# Patient Record
Sex: Female | Born: 1965 | Race: White | Hispanic: No | Marital: Married | Smoking: Never smoker
Health system: Southern US, Community
[De-identification: ages and names within clinical notes are randomized; demographics above are authoritative.]

## PROBLEM LIST (undated history)

## (undated) DIAGNOSIS — E7800 Pure hypercholesterolemia, unspecified: Secondary | ICD-10-CM

## (undated) DIAGNOSIS — E119 Type 2 diabetes mellitus without complications: Secondary | ICD-10-CM

## (undated) DIAGNOSIS — I10 Essential (primary) hypertension: Secondary | ICD-10-CM

## (undated) HISTORY — PX: ENDOMETRIAL ABLATION: SHX621

---

## 1999-08-05 ENCOUNTER — Other Ambulatory Visit (HOSPITAL_COMMUNITY): Admission: RE | Admit: 1999-08-05 | Discharge: 1999-08-27 | Payer: Self-pay | Admitting: Psychiatry

## 2006-06-06 ENCOUNTER — Encounter: Admission: RE | Admit: 2006-06-06 | Discharge: 2006-06-06 | Payer: Self-pay | Admitting: Specialist

## 2006-10-26 ENCOUNTER — Ambulatory Visit: Payer: Self-pay | Admitting: Orthopedic Surgery

## 2006-11-20 ENCOUNTER — Ambulatory Visit: Payer: Self-pay | Admitting: Orthopedic Surgery

## 2007-06-11 ENCOUNTER — Encounter: Admission: RE | Admit: 2007-06-11 | Discharge: 2007-06-11 | Payer: Self-pay | Admitting: Obstetrics and Gynecology

## 2008-06-23 ENCOUNTER — Encounter: Admission: RE | Admit: 2008-06-23 | Discharge: 2008-06-23 | Payer: Self-pay | Admitting: Obstetrics and Gynecology

## 2009-06-26 ENCOUNTER — Encounter: Admission: RE | Admit: 2009-06-26 | Discharge: 2009-06-26 | Payer: Self-pay | Admitting: Obstetrics and Gynecology

## 2010-06-28 ENCOUNTER — Encounter: Admission: RE | Admit: 2010-06-28 | Discharge: 2010-06-28 | Payer: Self-pay | Admitting: Obstetrics and Gynecology

## 2011-05-23 ENCOUNTER — Other Ambulatory Visit: Payer: Self-pay | Admitting: Family Medicine

## 2011-05-23 DIAGNOSIS — Z1231 Encounter for screening mammogram for malignant neoplasm of breast: Secondary | ICD-10-CM

## 2011-07-06 ENCOUNTER — Ambulatory Visit
Admission: RE | Admit: 2011-07-06 | Discharge: 2011-07-06 | Disposition: A | Discharge status: Home / Self Care | Payer: BC Managed Care – PPO | Source: Ambulatory Visit | Attending: Family Medicine | Admitting: Family Medicine

## 2011-07-06 DIAGNOSIS — Z1231 Encounter for screening mammogram for malignant neoplasm of breast: Secondary | ICD-10-CM

## 2011-07-11 ENCOUNTER — Telehealth (HOSPITAL_COMMUNITY): Payer: Self-pay | Admitting: Dietician

## 2011-07-18 ENCOUNTER — Ambulatory Visit: Payer: Self-pay

## 2011-08-10 NOTE — Telephone Encounter (Signed)
Sent letters to pt home via [place] [employer] on 07/13/11, 07/20/11, 07/27/11, and 08/04/11 in attempt to contact pt. Also provided information about diabetes class at [facility] with letter.  Attempts to contact pt have been unsuccessful thus far. Referral filed.

## 2011-10-04 ENCOUNTER — Telehealth (HOSPITAL_COMMUNITY): Payer: Self-pay | Admitting: Dietician

## 2011-10-04 NOTE — Telephone Encounter (Signed)
Appointment scheduled for 10/14/11 at 2:00 PM.

## 2011-10-10 ENCOUNTER — Telehealth (HOSPITAL_COMMUNITY): Payer: Self-pay | Admitting: Dietician

## 2011-10-10 NOTE — Telephone Encounter (Signed)
Received voicemail from pt at 11:58 AM. Pt requesting information on where appointment is located. Sent letter to pt home with appointment confirmation and instructions on how to get to [facility].

## 2011-10-14 ENCOUNTER — Encounter (HOSPITAL_COMMUNITY): Payer: Self-pay | Admitting: Dietician

## 2011-10-17 ENCOUNTER — Encounter (HOSPITAL_COMMUNITY): Payer: Self-pay | Admitting: Dietician

## 2011-10-17 NOTE — Progress Notes (Signed)
Outpatient Initial Nutrition Assessment  Date:10/14/2011   Time: 2:00 PM  Referring Physician: [facility] at [facility] Reason for Visit: pre-diabetes  Nutrition Assessment:  Ht: Height: 5\' 5"  (165.1 cm)   Wt:Weight: 192 lb (87.091 kg)   IBW: 125# %IBW: 154% UBW: 190# %UBW: 101% BMI: Body mass index is 31.95 kg/(m^2).  Goal Weight: 173# (10% weight loss) Weight hx: Pt reports that she is at her highest weight. Her lowest weight was 165-170# in her 20's.   Estimated nutritional needs: 1727-1884 kcals daily, 70-87 grams protein daily, 1.7-1.9 L fluid daily  PMH: History reviewed. No pertinent past medical history.  Medications:  Current Outpatient Rx  Name Route Sig Dispense Refill  . METFORMIN HCL 500 MG PO TABS Oral Take 500 mg by mouth daily.      Labs: CMP  No results found for this basename: na, k, cl, co2, glucose, bun, creatinine, calcium, prot, albumin, ast, alt, alkphos, bilitot, gfrnonaa, gfraa    Lipid Panel  No results found for this basename: chol, trig, hdl, cholhdl, vldl, ldlcalc     No results found for this basename: HGBA1C   No results found for this basename: GLUF, MICROALBUR, LDLCALC, CREATININE    Per records for [facility], Hgb A1c: 6.3.  Lifestyle/ social habits: Ms. Mohiuddin lives in [place], [place] with her husband. She has two grown sons, age 78 (who lives in [place]) and age 27 (who lives in [place]). She has 4 grandchildren. 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 She quit smoking in September 2012. She reports her stress level at a 9, citing work Counselling psychologist, Training and development officer at work, and caring for her father-in-law at a nursing home in [place] as her major sources of stress. She walks her dog 1-2 miles daily, but does not participate in any structured exercise outside of her job and home responsibilities. She reports frustration that her health has been declining since quitting  smoking. She does not like to take medications and would like to control her health through diet and exercise.   Nutrition hx/habits: Ms. Narvaez describes her eating habits as "sporadic". She eats "on the run" and often eats lunch at her desk, due to the demands of her job. She likes to eat a lot of fresh vegetables. She reports that "nothing tastes right expect eggs and fresh fruit" now that she has quit smoking. Most of her meals are prepared at home and her husband does most of the cooking. She has cut back on bread. She eats out 1-3 times per week, frequenting McDonald's, pizza places, or a take-out salad. She keeps applesauce in her drawer at work. She reports that she sometimes does not eat lunch and consequently feels her blood sugar drop. She drinks mostly unsweetened tea, water, and diet soda. She is looking forward to the summer so she can be more active and prepare most of her meals on the grill.   Diet recall: Breakfast (6:45 AM): oatmeal OR cheerios with 2% milk OR omelet with cheese, tomato, spinach, [place] bacon; Lunch (12:00): salad OR green beans, salad, and grilled chicken OR slice of pizza OR [place] sandwich; Dinner: chicken/fish.pork chop, steamed veggies   Nutrition Diagnosis: Inconsistent carbohydrate intake r/t disordered eating pattern AEB Hgb A1c: 6.3.  Nutrition Intervention: Nutrition rx:1500 kcal diabetic, NAS diet; 3 meals per day, 4-5 hours apart; limit 1 starch per meal; low calorie beverages only; 30 minutes physical activity daily  Education/Counseling Provided: Educated pt on diabetic diet principles.  Discussed sources of carbohydrate, portion sizes, and plate method. Discussed importance of eating 3 meals per day. Discussed importance of regular physical activity to assist with weight loss and optimizing glycemic control. Discussed small, moderate weight loss of 1-2# per week and goal of 10% weight loss. Provided plate method handout.    Understanding, Motivation,  Ability to Follow Recommendations: Expect fair to good compliance.   Monitoring and Evaluation: Goals: 1) 1-2# weight loss per week; 2) 30 minutes physical activity daily; 3) Hgb A1c < 6.3  Recommendations: 1) For weight loss: 4540-9811 kcals daily; 2) Make lunch night before to bring to work; 3) Prepare extra portions of food and freeze remainder of meal to use for later; 4) Walk during lunch break  F/U: PRN. Provided RD contact information.  Orlene Plum, RD  10/14/2011  Time: 2:00 PM

## 2015-01-15 ENCOUNTER — Emergency Department (HOSPITAL_COMMUNITY)
Admission: EM | Admit: 2015-01-15 | Discharge: 2015-01-15 | Disposition: A | Discharge status: Home / Self Care | Payer: BLUE CROSS/BLUE SHIELD | Source: Home / Self Care | Attending: Family Medicine | Admitting: Family Medicine

## 2015-01-15 ENCOUNTER — Encounter (HOSPITAL_COMMUNITY): Payer: Self-pay | Admitting: *Deleted

## 2015-01-15 DIAGNOSIS — R05 Cough: Secondary | ICD-10-CM

## 2015-01-15 DIAGNOSIS — R0982 Postnasal drip: Secondary | ICD-10-CM

## 2015-01-15 DIAGNOSIS — R059 Cough, unspecified: Secondary | ICD-10-CM

## 2015-01-15 MED ORDER — GUAIFENESIN-CODEINE 100-10 MG/5ML PO SOLN: 5.0000 mL | Freq: Four times a day (QID) | ORAL | Status: AC | PRN

## 2015-01-15 MED ORDER — IPRATROPIUM BROMIDE 0.06 % NA SOLN: 2.0000 | Freq: Four times a day (QID) | NASAL | Status: AC

## 2015-01-15 MED ORDER — BENZONATATE 100 MG PO CAPS: 100.0000 mg | ORAL_CAPSULE | Freq: Three times a day (TID) | ORAL | Status: AC | PRN

## 2015-01-15 MED ORDER — FLUTICASONE PROPIONATE 50 MCG/ACT NA SUSP: 2.0000 | Freq: Every day | NASAL | Status: AC

## 2015-01-15 NOTE — Discharge Instructions (Signed)
Your symptoms are likely due to nasal sinus irritation causing postnasal drip and lung irritation. Has no signs of pneumonia or other serious infection at this point time. Please send a nasal Atrovent to help dry. Nasal secretions, Flonase at night to decrease the inflammation. Please also consider using a daily allergy medicine such as Zyrtec or Allegra. Please use the Tessalon Perles during the daytime for cough relief and the Robitussin-AC at night for sleep.

## 2015-01-15 NOTE — ED Notes (Signed)
Pt is here with complaints of persistent cough. Pt reports congestion, HA, sore throat, and chills that have resolved. However dry cough has continued since 01/07/15.

## 2015-01-15 NOTE — ED Provider Notes (Signed)
CSN: [id number]     Arrival date & time 01/15/15  1327 History   First MD Initiated Contact with Patient 01/15/15 1425     Chief Complaint  Patient presents with  . Cough   (Consider location/radiation/quality/duration/timing/severity/associated sxs/prior Treatment) HPI   Cough: dry. Ongoing for 1 week. Tussin DM w/ some improvement. Worse at night. Occasional w/ minimal phlegm production. Started out w/ rinorrhea, and upper respiratory congestion. Denies fevers, SOB, CP, nasuea, vomiting, HA.   History reviewed. No pertinent past medical history. History reviewed. No pertinent past surgical history. Family History  Problem Relation Age of Onset  . Diabetes type II Mother   . Hypertension Mother   . Diabetes type II Other   . Diabetes type II Other    History  Substance Use Topics  . Smoking status: Never Smoker   . Smokeless tobacco: Not on file  . Alcohol Use: No   OB History    No data available     Review of Systems Per HPI with all other pertinent systems negative.   Allergies  Review of patient's allergies indicates no known allergies.  Home Medications   Prior to Admission medications   Medication Sig Start Date End Date Taking? Authorizing Provider  benzonatate (TESSALON PERLES) 100 MG capsule Take 1-2 capsules (100-200 mg total) by mouth 3 (three) times daily as needed for cough. 01/15/15   Ozella Rocks, MD  fluticasone (FLONASE) 50 MCG/ACT nasal spray Place 2 sprays into both nostrils at bedtime. 01/15/15   Ozella Rocks, MD  guaiFENesin-codeine 100-10 MG/5ML syrup Take 5-10 mLs by mouth every 6 (six) hours as needed for cough. 01/15/15   Ozella Rocks, MD  ipratropium (ATROVENT) 0.06 % nasal spray Place 2 sprays into both nostrils 4 (four) times daily. 01/15/15   Ozella Rocks, MD  metFORMIN (GLUCOPHAGE) 500 MG tablet Take 500 mg by mouth daily.    Historical Provider, MD   BP 167/81 mmHg  Pulse 95  Temp(Src) 98.8 F (37.1 C) (Oral)  Resp 16  SpO2  98% Physical Exam Physical Exam  Constitutional: oriented to person, place, and time. appears well-developed and well-nourished. No distress.  HENT:  Head: Normocephalic and atraumatic.  Boggy nasal turbinates.  Minimal pharyngeal cobblestoning Eyes: EOMI. PERRL.  Neck: Normal range of motion.  Cardiovascular: RRR, no m/r/g, 2+ distal pulses,  Pulmonary/Chest: Effort normal and breath sounds normal. No respiratory distress.  Abdominal: Soft. Bowel sounds are normal. NonTTP, no distension.  Musculoskeletal: Normal range of motion. Non ttp, no effusion.  Neurological: alert and oriented to person, place, and time.  Skin: Skin is warm. No rash noted. non diaphoretic.  Psychiatric: normal mood and affect. behavior is normal. Judgment and thought content normal.   ED Course  Procedures (including critical care time) Labs Review Labs Reviewed - No data to display  Imaging Review No results found.   MDM   1. Post-nasal drip   2. Cough    Nasal Atrovent, Flonase, Zyrtec, Tessalon Perles, Robitussin-AC, Discussed likely benign nature of symptoms.    Ozella Rocks, MD 01/15/15 [id number]

## 2015-07-06 ENCOUNTER — Other Ambulatory Visit: Payer: Self-pay

## 2015-07-06 DIAGNOSIS — Z1231 Encounter for screening mammogram for malignant neoplasm of breast: Secondary | ICD-10-CM

## 2015-08-05 ENCOUNTER — Ambulatory Visit: Payer: Self-pay

## 2015-08-11 ENCOUNTER — Ambulatory Visit
Admission: RE | Admit: 2015-08-11 | Discharge: 2015-08-11 | Disposition: A | Discharge status: Home / Self Care | Payer: BLUE CROSS/BLUE SHIELD | Source: Ambulatory Visit

## 2015-08-11 DIAGNOSIS — Z1231 Encounter for screening mammogram for malignant neoplasm of breast: Secondary | ICD-10-CM

## 2015-08-12 ENCOUNTER — Other Ambulatory Visit: Payer: Self-pay | Admitting: Family Medicine

## 2015-08-12 DIAGNOSIS — R928 Other abnormal and inconclusive findings on diagnostic imaging of breast: Secondary | ICD-10-CM

## 2015-08-18 ENCOUNTER — Other Ambulatory Visit: Payer: Self-pay

## 2015-08-21 ENCOUNTER — Ambulatory Visit
Admission: RE | Admit: 2015-08-21 | Discharge: 2015-08-21 | Disposition: A | Discharge status: Home / Self Care | Payer: BLUE CROSS/BLUE SHIELD | Source: Ambulatory Visit | Attending: Family Medicine | Admitting: Family Medicine

## 2015-08-21 DIAGNOSIS — R928 Other abnormal and inconclusive findings on diagnostic imaging of breast: Secondary | ICD-10-CM

## 2016-11-16 ENCOUNTER — Other Ambulatory Visit: Payer: Self-pay | Admitting: Family Medicine

## 2016-11-16 DIAGNOSIS — Z1231 Encounter for screening mammogram for malignant neoplasm of breast: Secondary | ICD-10-CM

## 2016-12-12 ENCOUNTER — Ambulatory Visit
Admission: RE | Admit: 2016-12-12 | Discharge: 2016-12-12 | Disposition: A | Discharge status: Home / Self Care | Payer: BLUE CROSS/BLUE SHIELD | Source: Ambulatory Visit | Attending: Family Medicine | Admitting: Family Medicine

## 2016-12-12 DIAGNOSIS — Z1231 Encounter for screening mammogram for malignant neoplasm of breast: Secondary | ICD-10-CM

## 2019-04-17 ENCOUNTER — Other Ambulatory Visit: Payer: Self-pay | Admitting: Family Medicine

## 2019-04-17 DIAGNOSIS — Z1231 Encounter for screening mammogram for malignant neoplasm of breast: Secondary | ICD-10-CM

## 2019-05-31 ENCOUNTER — Other Ambulatory Visit: Payer: Self-pay

## 2019-05-31 ENCOUNTER — Ambulatory Visit
Admission: RE | Admit: 2019-05-31 | Discharge: 2019-05-31 | Disposition: A | Discharge status: Home / Self Care | Payer: BC Managed Care – PPO | Source: Ambulatory Visit | Attending: Family Medicine | Admitting: Family Medicine

## 2019-05-31 DIAGNOSIS — Z1231 Encounter for screening mammogram for malignant neoplasm of breast: Secondary | ICD-10-CM

## 2020-05-26 ENCOUNTER — Other Ambulatory Visit: Payer: Self-pay | Admitting: Family Medicine

## 2020-05-26 DIAGNOSIS — Z1231 Encounter for screening mammogram for malignant neoplasm of breast: Secondary | ICD-10-CM

## 2020-08-14 ENCOUNTER — Ambulatory Visit
Admission: RE | Admit: 2020-08-14 | Discharge: 2020-08-14 | Disposition: A | Discharge status: Home / Self Care | Payer: 59 | Source: Ambulatory Visit | Attending: Family Medicine | Admitting: Family Medicine

## 2020-08-14 ENCOUNTER — Other Ambulatory Visit: Payer: Self-pay

## 2020-08-14 DIAGNOSIS — Z1231 Encounter for screening mammogram for malignant neoplasm of breast: Secondary | ICD-10-CM

## 2021-07-15 ENCOUNTER — Emergency Department (HOSPITAL_COMMUNITY): Payer: 59

## 2021-07-15 ENCOUNTER — Emergency Department (HOSPITAL_COMMUNITY)
Admission: EM | Admit: 2021-07-15 | Discharge: 2021-07-15 | Disposition: A | Discharge status: Home / Self Care | Payer: 59 | Attending: Emergency Medicine | Admitting: Emergency Medicine

## 2021-07-15 ENCOUNTER — Encounter (HOSPITAL_COMMUNITY): Payer: Self-pay | Admitting: *Deleted

## 2021-07-15 ENCOUNTER — Other Ambulatory Visit: Payer: Self-pay

## 2021-07-15 DIAGNOSIS — M25561 Pain in right knee: Secondary | ICD-10-CM

## 2021-07-15 DIAGNOSIS — Z7984 Long term (current) use of oral hypoglycemic drugs: Secondary | ICD-10-CM | POA: Diagnosis not present

## 2021-07-15 DIAGNOSIS — I10 Essential (primary) hypertension: Secondary | ICD-10-CM | POA: Diagnosis not present

## 2021-07-15 DIAGNOSIS — M5431 Sciatica, right side: Secondary | ICD-10-CM

## 2021-07-15 DIAGNOSIS — Z7951 Long term (current) use of inhaled steroids: Secondary | ICD-10-CM | POA: Diagnosis not present

## 2021-07-15 DIAGNOSIS — M79604 Pain in right leg: Secondary | ICD-10-CM | POA: Insufficient documentation

## 2021-07-15 DIAGNOSIS — E119 Type 2 diabetes mellitus without complications: Secondary | ICD-10-CM | POA: Insufficient documentation

## 2021-07-15 DIAGNOSIS — M5441 Lumbago with sciatica, right side: Secondary | ICD-10-CM | POA: Insufficient documentation

## 2021-07-15 DIAGNOSIS — M25551 Pain in right hip: Secondary | ICD-10-CM | POA: Diagnosis present

## 2021-07-15 DIAGNOSIS — M545 Low back pain, unspecified: Secondary | ICD-10-CM

## 2021-07-15 HISTORY — DX: Pure hypercholesterolemia, unspecified: E78.00

## 2021-07-15 HISTORY — DX: Type 2 diabetes mellitus without complications: E11.9

## 2021-07-15 HISTORY — DX: Essential (primary) hypertension: I10

## 2021-07-15 LAB — CBG MONITORING, ED: Glucose-Capillary: 111 mg/dL — ABNORMAL HIGH (ref 70–99)

## 2021-07-15 MED ORDER — HYDROCODONE-ACETAMINOPHEN 5-325 MG PO TABS: 1.0000 | ORAL_TABLET | ORAL | 0 refills | Status: AC | PRN

## 2021-07-15 MED ORDER — IBUPROFEN 600 MG PO TABS: 600.0000 mg | ORAL_TABLET | Freq: Four times a day (QID) | ORAL | 0 refills | Status: AC | PRN

## 2021-07-15 MED ORDER — HYDROCODONE-ACETAMINOPHEN 5-325 MG PO TABS
1.0000 | ORAL_TABLET | Freq: Once | ORAL | Status: AC
  Administered 2021-07-15: 1 via ORAL
  Filled 2021-07-15: qty 1

## 2021-07-15 MED ORDER — DIAZEPAM 5 MG PO TABS
5.0000 mg | ORAL_TABLET | Freq: Once | ORAL | Status: AC
  Administered 2021-07-15: 5 mg via ORAL
  Filled 2021-07-15: qty 1

## 2021-07-15 MED ORDER — LIDOCAINE 5 % EX PTCH: 1.0000 | MEDICATED_PATCH | Freq: Every day | CUTANEOUS | 0 refills | Status: AC | PRN

## 2021-07-15 MED ORDER — LIDOCAINE 5 % EX PTCH
1.0000 | MEDICATED_PATCH | CUTANEOUS | Status: DC
  Administered 2021-07-15: 1 via TRANSDERMAL
  Filled 2021-07-15 (×2): qty 1

## 2021-07-15 MED ORDER — METHOCARBAMOL 500 MG PO TABS: 1000.0000 mg | ORAL_TABLET | Freq: Two times a day (BID) | ORAL | 0 refills | Status: AC

## 2021-07-15 MED ORDER — KETOROLAC TROMETHAMINE 60 MG/2ML IM SOLN
60.0000 mg | Freq: Once | INTRAMUSCULAR | Status: AC
  Administered 2021-07-15: 60 mg via INTRAMUSCULAR
  Filled 2021-07-15: qty 2

## 2021-07-15 MED ORDER — LIDOCAINE 5 % EX PTCH
1.0000 | MEDICATED_PATCH | CUTANEOUS | Status: DC
  Administered 2021-07-15: 1 via TRANSDERMAL

## 2021-07-15 MED ORDER — HYDROMORPHONE HCL 1 MG/ML IJ SOLN
1.0000 mg | Freq: Once | INTRAMUSCULAR | Status: AC
  Administered 2021-07-15: 1 mg via SUBCUTANEOUS
  Filled 2021-07-15: qty 1

## 2021-07-15 MED ORDER — IBUPROFEN 800 MG PO TABS
800.0000 mg | ORAL_TABLET | Freq: Once | ORAL | Status: AC
  Administered 2021-07-15: 800 mg via ORAL
  Filled 2021-07-15: qty 1

## 2021-07-15 NOTE — ED Triage Notes (Signed)
Pt c/o right hip pain worsening over the last 4 days. Pt has seen a chiropractor and was then told to come to the ED for further evaluation. Pt fell down a hill several weeks ago and had some trouble with it then, but not nearly as bad as now.

## 2021-07-15 NOTE — ED Notes (Signed)
Assisted pt with ambulation to restroom.

## 2021-07-15 NOTE — ED Provider Notes (Signed)
[facility] EMERGENCY DEPARTMENT Provider Note   CSN: [id number] Arrival date & time: 07/15/21  3825     History Chief Complaint  Patient presents with   Hip Pain    Joy King is a 55 y.o. female.  This is a 55 y.o. female with significant medical history as below, including DM, HLD, HTN who presents to the ED with complaint of hip / leg pain to the right.  Pain has been ongoing for approximately 3 to 4 weeks, worse in the past week or so patient with a fall 1.5 weeks ago that she thinks may have provoked worsening symptoms.  She slipped down a grassy hill.  Was able to ambulate afterwards.  No head injury, no LOC.  No ongoing back pain, no change in bowel or bladder function.  No numbness or tingling.  Patient has been having right-sided pain posteriorly on her hip that is radiating down the posterior portion of her leg.  She has been seen by the car proximal to times in the past few days without significant improvement to her symptoms.  She has been taking Motrin and Tylenol intermittently for symptom control which has not significantly improved her symptoms.  Reports that she received a cortisone and a Toradol shot in the past couple days which did help her symptoms transiently but pain has returned.  No fevers or chills.  No rashes.  She is having difficulty ambulating secondary to discomfort.  The history is provided by the spouse and the patient. No language interpreter was used.  Hip Pain Pertinent negatives include no chest pain, no abdominal pain, no headaches and no shortness of breath.      Past Medical History:  Diagnosis Date   Diabetes mellitus without complication (HCC)    High cholesterol    Hypertension     There are no problems to display for this patient.   Past Surgical History:  Procedure Laterality Date   ENDOMETRIAL ABLATION       OB History   No obstetric history on file.     Family History  Problem Relation Age of Onset   Diabetes type II Mother     Hypertension Mother    Diabetes type II Other    Diabetes type II Other     Social History   Tobacco Use   Smoking status: Never  Vaping Use   Vaping Use: Never used  Substance Use Topics   Alcohol use: No   Drug use: No    Home Medications Prior to Admission medications   Medication Sig Start Date End Date Taking? Authorizing Provider  acetaminophen (TYLENOL) 500 MG tablet Take 1,000 mg by mouth every 6 (six) hours as needed for mild pain.   Yes [provider]  atorvastatin (LIPITOR) 40 MG tablet Take 40 mg by mouth daily. 04/21/21  Yes [provider]  BIOTIN PO Take 1 tablet by mouth daily.   Yes [provider]  fluticasone (FLONASE) 50 MCG/ACT nasal spray Place 2 sprays into both nostrils at bedtime. 01/15/15  Yes Ozella Rocks, MD  HYDROcodone-acetaminophen (NORCO/VICODIN) 5-325 MG tablet Take 1 tablet by mouth every 4 (four) hours as needed. 07/15/21  Yes Tanda Rockers A, DO  ibuprofen (ADVIL) 200 MG tablet Take 200 mg by mouth every 6 (six) hours as needed for mild pain.   Yes [provider]  ibuprofen (ADVIL) 600 MG tablet Take 1 tablet (600 mg total) by mouth every 6 (six) hours as needed. 07/15/21  Yes Tanda Rockers A, DO  lidocaine (LIDODERM) 5 % Place 1 patch onto the skin daily as needed. Remove & Discard patch within 12 hours or as directed by MD 07/15/21  Yes Tanda Rockers A, DO  lisinopril (ZESTRIL) 10 MG tablet Take 10 mg by mouth daily. 04/21/21  Yes [provider]  metFORMIN (GLUCOPHAGE) 500 MG tablet Take 500 mg by mouth 2 (two) times daily with a meal.   Yes [provider]  methocarbamol (ROBAXIN) 500 MG tablet Take 2 tablets (1,000 mg total) by mouth 2 (two) times daily for 5 days. 07/15/21 07/20/21 Yes Tanda Rockers A, DO  tiZANidine (ZANAFLEX) 4 MG tablet Take 4 mg by mouth 2 (two) times daily as needed. 06/07/21  Yes [provider]  VITAMIN D PO Take 1 tablet by mouth daily.   Yes [provider]  benzonatate (TESSALON PERLES) 100 MG capsule Take 1-2 capsules (100-200 mg total) by mouth 3 (three) times daily as needed for cough. Patient not taking: Reported on 07/15/2021 01/15/15   Ozella Rocks, MD  guaiFENesin-codeine 100-10 MG/5ML syrup Take 5-10 mLs by mouth every 6 (six) hours as needed for cough. Patient not taking: Reported on 07/15/2021 01/15/15   Ozella Rocks, MD  ipratropium (ATROVENT) 0.06 % nasal spray Place 2 sprays into both nostrils 4 (four) times daily. Patient not taking: Reported on 07/15/2021 01/15/15   Ozella Rocks, MD    Allergies    Patient has no known allergies.  Review of Systems   Review of Systems  Constitutional:  Negative for activity change and fever.  HENT:  Negative for facial swelling and trouble swallowing.   Eyes:  Negative for discharge and redness.  Respiratory:  Negative for cough and shortness of breath.   Cardiovascular:  Negative for chest pain and palpitations.  Gastrointestinal:  Negative for abdominal pain and nausea.  Genitourinary:  Negative for dysuria and flank pain.  Musculoskeletal:  Positive for arthralgias and gait problem. Negative for back pain.  Skin:  Negative for pallor and rash.  Neurological:  Negative for syncope and headaches.   Physical Exam Updated Vital Signs BP (!) 117/49   Pulse 74   Temp 98 F (36.7 C) (Oral)   Resp 16   Ht 5\' 5"  (1.651 m)   Wt 73.5 kg   SpO2 100%   BMI 26.96 kg/m   Physical Exam Vitals and nursing note reviewed.  Constitutional:      General: She is not in acute distress.    Appearance: Normal appearance.  HENT:     Head: Normocephalic and atraumatic.     Right Ear: External ear normal.     Left Ear: External ear normal.     Nose: Nose normal.     Mouth/Throat:     Mouth: Mucous membranes are moist.  Eyes:     General: No scleral icterus.       Right eye: No discharge.        Left eye: No discharge.  Cardiovascular:     Rate and Rhythm: Normal rate and  regular rhythm.     Pulses: Normal pulses.     Heart sounds: Normal heart sounds.  Pulmonary:     Effort: Pulmonary effort is normal. No respiratory distress.     Breath sounds: Normal breath sounds.  Abdominal:     General: Abdomen is flat.     Tenderness: There is no abdominal tenderness.  Musculoskeletal:  General: Normal range of motion.     Cervical back: Normal range of motion.     Right lower leg: No edema.     Left lower leg: No edema.       Legs:     Comments: Lower extremities neurovascular intact.  No lower extremity edema.  2+ DP and PT pulses bilateral symmetric.  No midline spinous process tenderness palpation or percussion, no crepitus or step-off, rectal tone is intact.  Skin:    General: Skin is warm and dry.     Capillary Refill: Capillary refill takes less than 2 seconds.  Neurological:     Mental Status: She is alert.     Sensory: Sensation is intact.  Psychiatric:        Mood and Affect: Mood normal.        Behavior: Behavior normal.    ED Results / Procedures / Treatments   Labs (all labs ordered are listed, but only abnormal results are displayed) Labs Reviewed  CBG MONITORING, ED - Abnormal; Notable for the following components:      Result Value   Glucose-Capillary 111 (*)    All other components within normal limits    EKG None  Radiology DG Lumbar Spine Complete  Result Date: 07/15/2021 CLINICAL DATA:  Back pain, right radicular pain EXAM: LUMBAR SPINE - COMPLETE 4+ VIEW COMPARISON:  None. FINDINGS: No recent fracture is seen. Alignment of posterior margins of vertebral bodies is unremarkable. Degenerative changes are noted with bony spurs, disc space narrowing and facet hypertrophy, more severe at L4-L5 and L5-S1 levels. There are scattered arterial calcifications. IMPRESSION: No recent fracture is seen in the lumbar spine. Lumbar spondylosis more severe at L4-L5 and L5-S1 levels. Electronically Signed   By: Ernie Avena M.D.    On: 07/15/2021 10:14   DG Knee Complete 4 Views Right  Result Date: 07/15/2021 CLINICAL DATA:  Right knee pain for 1 week. EXAM: RIGHT KNEE - COMPLETE 4+ VIEW COMPARISON:  None. FINDINGS: No evidence of fracture, dislocation, or joint effusion. No evidence of arthropathy or other focal bone abnormality. Mild peripheral vascular calcification noted. IMPRESSION: No radiographic abnormality of right knee. Mild peripheral vascular calcification incidentally noted. Electronically Signed   By: Danae Orleans M.D.   On: 07/15/2021 10:11   DG Hip Unilat W or Wo Pelvis 2-3 Views Right  Result Date: 07/15/2021 CLINICAL DATA:  Right leg pain for 1 week. EXAM: DG HIP (WITH OR WITHOUT PELVIS) 2-3V RIGHT COMPARISON:  None. FINDINGS: AP view of the pelvis and AP/frog leg views of the right hip. Femoral heads are located. Sacroiliac joints are symmetric. No acute fracture. Joint spaces are maintained for age. Osteophyte or accessory ossicle about the lateral right acetabulum. IMPRESSION: No acute osseous abnormality. Electronically Signed   By: Jeronimo Greaves M.D.   On: 07/15/2021 11:50    Procedures Procedures   Medications Ordered in ED Medications  lidocaine (LIDODERM) 5 % 1 patch (1 patch Transdermal Patch Applied 07/15/21 0900)  lidocaine (LIDODERM) 5 % 1 patch (1 patch Transdermal Patch Applied 07/15/21 1234)  HYDROcodone-acetaminophen (NORCO/VICODIN) 5-325 MG per tablet 1 tablet (1 tablet Oral Given 07/15/21 0859)  ketorolac (TORADOL) injection 60 mg (60 mg Intramuscular Given 07/15/21 0901)  diazepam (VALIUM) tablet 5 mg (5 mg Oral Given 07/15/21 0859)  HYDROmorphone (DILAUDID) injection 1 mg (1 mg Subcutaneous Given 07/15/21 1224)  ibuprofen (ADVIL) tablet 800 mg (800 mg Oral Given 07/15/21 1224)    ED Course  I have reviewed the  triage vital signs and the nursing notes.  Pertinent labs & imaging results that were available during my care of the patient were reviewed by me and considered in my medical  decision making (see chart for details).    MDM Rules/Calculators/A&P                           CC: leg pain  This patient complains of above; this involves an extensive number of treatment options and is a complaint that carries with it a high risk of complications and morbidity. Vital signs were reviewed. Serious etiologies considered.    Record review:  Previous records obtained and reviewed   Additional history obtained from spouse  Work up as above, notable for:   imaging results that were available during my care of the patient were reviewed by me and considered in my medical decision making.   I ordered imaging studies which included hip/pelvis xr right and I independently visualized and interpreted imaging which showed lumbar spondylosis, otherwise no abnormalities noted  Management: Patient given Toradol, Norco, Valium, lidocaine patch.  Reassessment:  Neuro exam is non-focal, no saddle paresthesias, no midline back pain, no fevers or chills, no change to urination or bowel function/no overflow or incontinence, low suspicion for cord compression.  Patient reports she is feeling much better after intervention.  She is ambulatory.  Neuro vascularly intact bilateral lower extremities.  Stable For discharge.  High suspicion for pain 2/2 sciatica. Pain improved after intervention in the ED. Recommend supportive care at home and close pcp f/u        This chart was dictated using voice recognition software.  Despite best efforts to proofread,  errors can occur which can change the documentation meaning.  Final Clinical Impression(s) / ED Diagnoses Final diagnoses:  Sciatica of right side    Rx / DC Orders ED Discharge Orders          Ordered    HYDROcodone-acetaminophen (NORCO/VICODIN) 5-325 MG tablet  Every 4 hours PRN        07/15/21 1329    ibuprofen (ADVIL) 600 MG tablet  Every 6 hours PRN        07/15/21 1329    lidocaine (LIDODERM) 5 %  Daily PRN         07/15/21 1329    methocarbamol (ROBAXIN) 500 MG tablet  2 times daily        07/15/21 1329             Sloan Leiter, DO 07/15/21 1331

## 2021-07-15 NOTE — ED Notes (Signed)
This nurse called Xray to get update on results of DG Hip Unilat. Xray stated that they would look into it. EDP updated.

## 2022-08-15 IMAGING — DX DG LUMBAR SPINE COMPLETE 4+V
5 series · 5 of 5 positions shown · non-contrast
Comparison: None.

CLINICAL DATA: Back pain, right radicular pain

EXAM:
LUMBAR SPINE - COMPLETE 4+ VIEW

[l-spine ap]
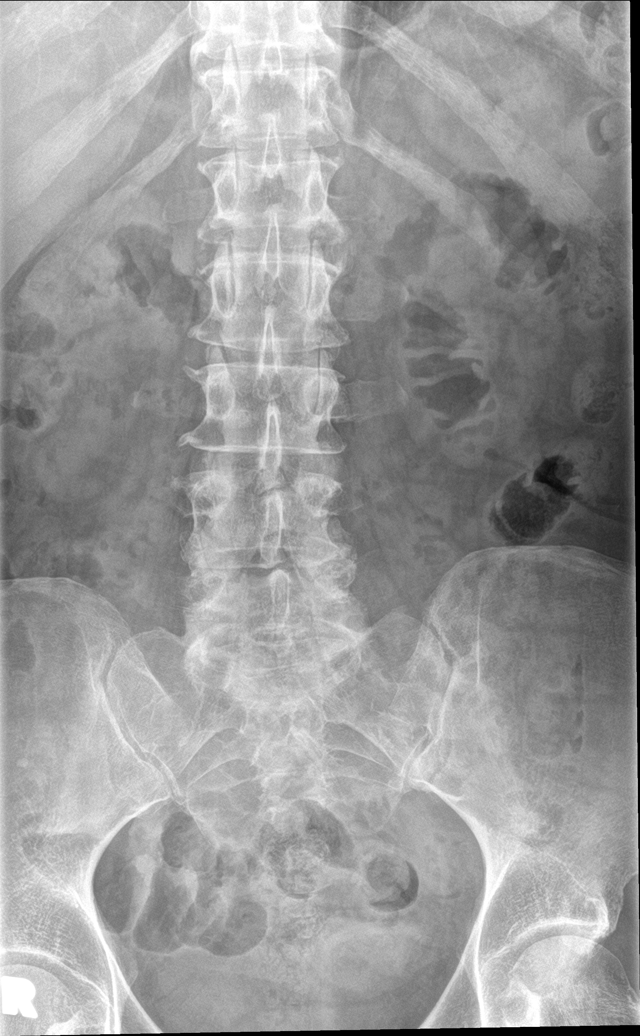

[l-spine obl (1 of 2)]
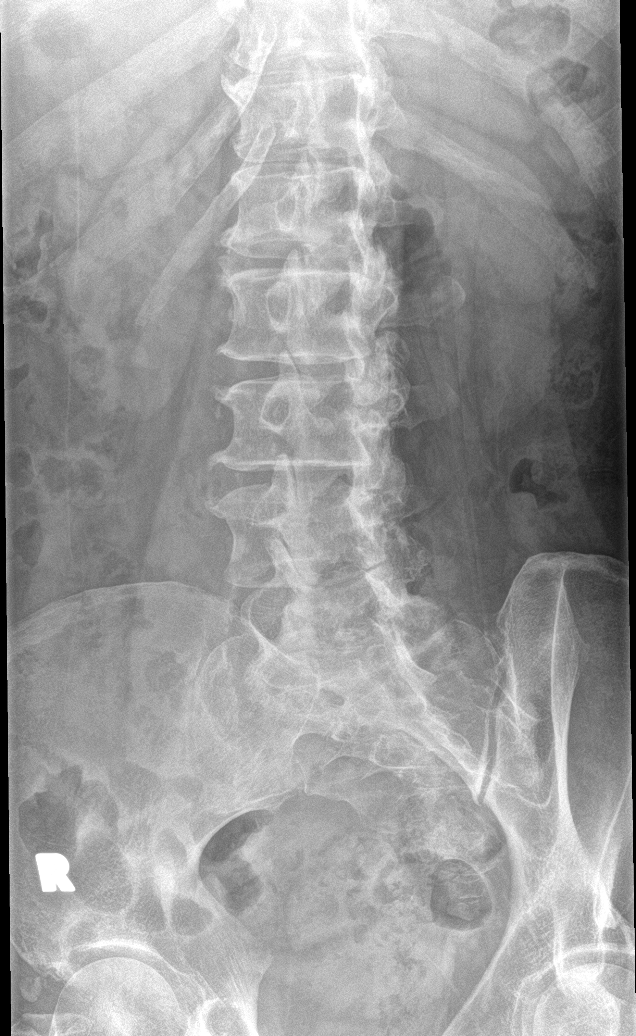

[l-spine obl (2 of 2)]
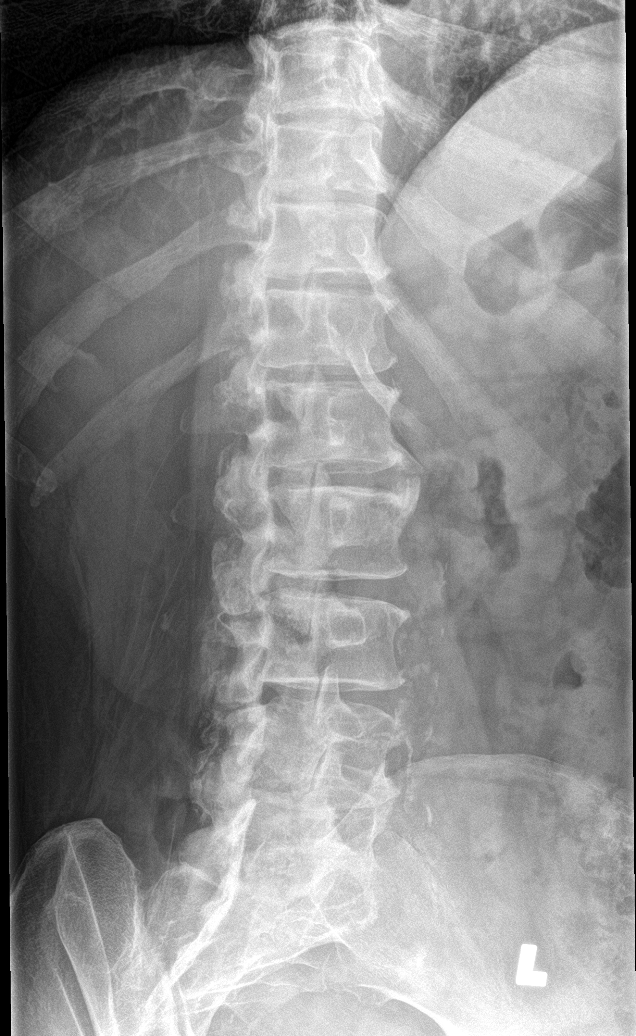

[l-spine lat]
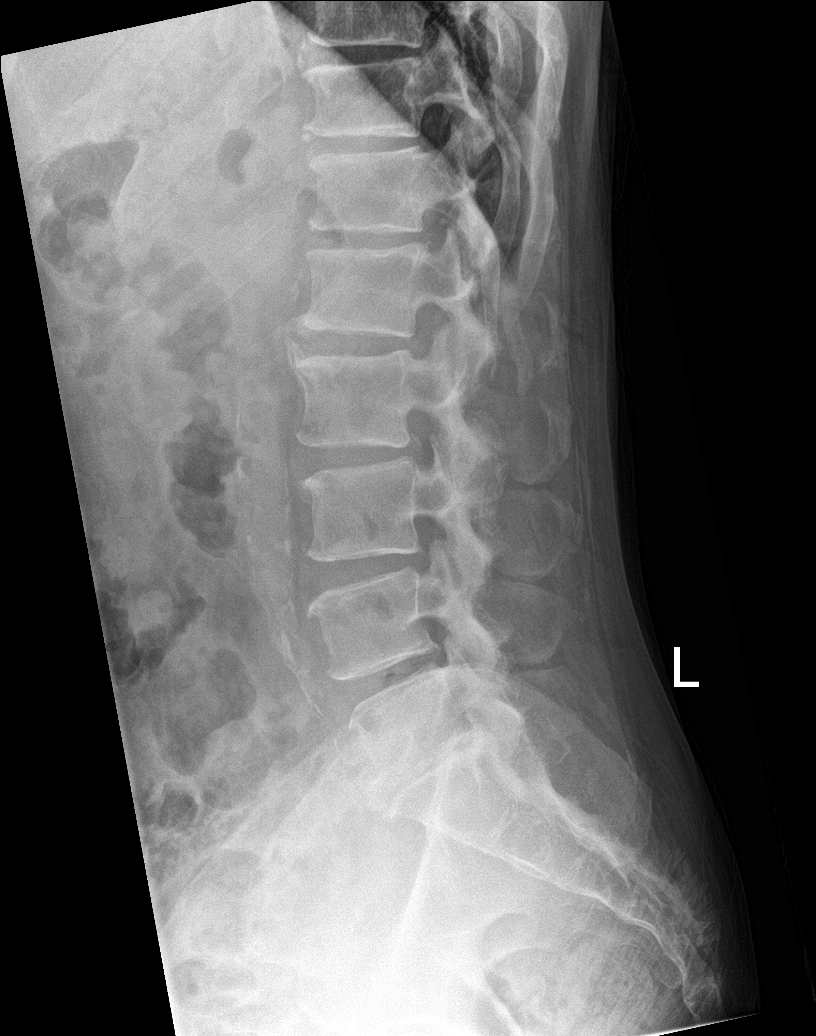

[l-spine spot]
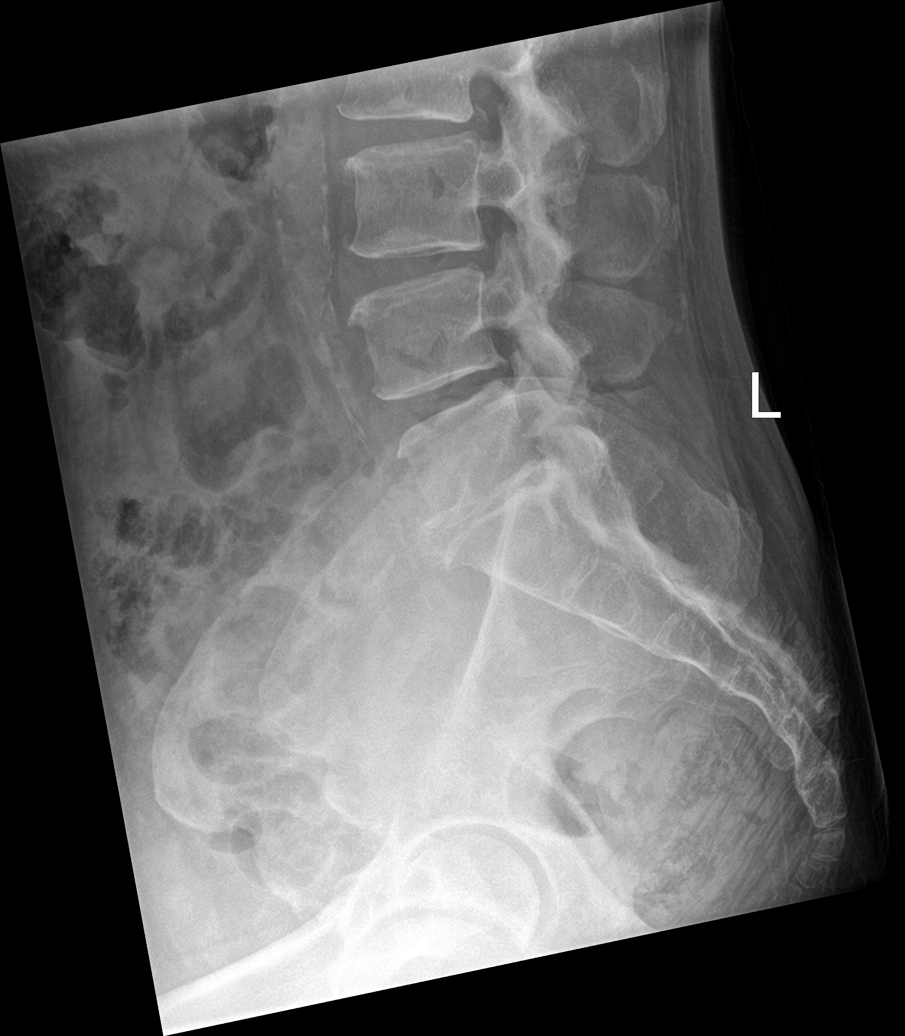

[5 of 5 positions shown; findings below may reference images not displayed]

FINDINGS: No recent fracture is seen. Alignment of posterior margins of
vertebral bodies is unremarkable. Degenerative changes are noted
with bony spurs, disc space narrowing and facet hypertrophy, more
severe at L4-L5 and L5-S1 levels. There are scattered arterial
calcifications.
IMPRESSION: No recent fracture is seen in the lumbar spine. Lumbar spondylosis
more severe at L4-L5 and L5-S1 levels.
